# Patient Record
Sex: Female | Born: 1955 | Race: White | Hispanic: No | Smoking: Never smoker
Health system: Southern US, Community
[De-identification: ages and names within clinical notes are randomized; demographics above are authoritative.]

## PROBLEM LIST (undated history)

## (undated) DIAGNOSIS — D649 Anemia, unspecified: Secondary | ICD-10-CM

## (undated) DIAGNOSIS — E039 Hypothyroidism, unspecified: Secondary | ICD-10-CM

## (undated) DIAGNOSIS — M1990 Unspecified osteoarthritis, unspecified site: Secondary | ICD-10-CM

## (undated) DIAGNOSIS — E739 Lactose intolerance, unspecified: Secondary | ICD-10-CM

## (undated) DIAGNOSIS — K635 Polyp of colon: Secondary | ICD-10-CM

## (undated) DIAGNOSIS — F419 Anxiety disorder, unspecified: Secondary | ICD-10-CM

## (undated) DIAGNOSIS — M8580 Other specified disorders of bone density and structure, unspecified site: Secondary | ICD-10-CM

## (undated) HISTORY — DX: Hypothyroidism, unspecified: E03.9

## (undated) HISTORY — DX: Unspecified osteoarthritis, unspecified site: M19.90

## (undated) HISTORY — PX: REPLACEMENT TOTAL KNEE: SUR1224

## (undated) HISTORY — PX: INGUINAL HERNIA REPAIR: SUR1180

## (undated) HISTORY — DX: Other specified disorders of bone density and structure, unspecified site: M85.80

## (undated) HISTORY — DX: Lactose intolerance, unspecified: E73.9

## (undated) HISTORY — DX: Anxiety disorder, unspecified: F41.9

## (undated) HISTORY — PX: TONSILLECTOMY: SUR1361

## (undated) HISTORY — DX: Anemia, unspecified: D64.9

## (undated) HISTORY — DX: Polyp of colon: K63.5

---

## 2003-07-13 ENCOUNTER — Other Ambulatory Visit: Admission: RE | Admit: 2003-07-13 | Discharge: 2003-07-13 | Payer: Self-pay | Admitting: Family Medicine

## 2004-04-05 ENCOUNTER — Encounter (INDEPENDENT_AMBULATORY_CARE_PROVIDER_SITE_OTHER): Payer: Self-pay | Admitting: Specialist

## 2004-04-05 ENCOUNTER — Ambulatory Visit (HOSPITAL_COMMUNITY): Admission: RE | Admit: 2004-04-05 | Discharge: 2004-04-06 | Payer: Self-pay | Admitting: Surgery

## 2004-08-28 ENCOUNTER — Other Ambulatory Visit: Admission: RE | Admit: 2004-08-28 | Discharge: 2004-08-28 | Payer: Self-pay | Admitting: Obstetrics and Gynecology

## 2006-06-04 ENCOUNTER — Ambulatory Visit: Payer: Self-pay | Admitting: Endocrinology

## 2006-06-04 LAB — CONVERTED CEMR LAB
Calcium, Total (PTH): 9 mg/dL (ref 8.4–10.5)
PTH: 12 pg/mL — ABNORMAL LOW (ref 14.0–72.0)
TSH: 0.31 microintl units/mL — ABNORMAL LOW (ref 0.35–5.50)

## 2006-08-15 ENCOUNTER — Other Ambulatory Visit: Admission: RE | Admit: 2006-08-15 | Discharge: 2006-08-15 | Payer: Self-pay | Admitting: Obstetrics and Gynecology

## 2007-01-11 ENCOUNTER — Encounter: Payer: Self-pay | Admitting: *Deleted

## 2007-01-11 DIAGNOSIS — E0500 Thyrotoxicosis with diffuse goiter without thyrotoxic crisis or storm: Secondary | ICD-10-CM | POA: Insufficient documentation

## 2007-01-11 DIAGNOSIS — M810 Age-related osteoporosis without current pathological fracture: Secondary | ICD-10-CM | POA: Insufficient documentation

## 2007-01-11 DIAGNOSIS — E0590 Thyrotoxicosis, unspecified without thyrotoxic crisis or storm: Secondary | ICD-10-CM | POA: Insufficient documentation

## 2007-01-11 DIAGNOSIS — F329 Major depressive disorder, single episode, unspecified: Secondary | ICD-10-CM | POA: Insufficient documentation

## 2010-08-25 NOTE — Consult Note (Signed)
[facility]                          ENDOCRINOLOGY CONSULTATION   NAME:Pontillo, Tangi                          MRN:          [id number]  DATE:06/04/2006                            DOB:          [birth year]    She is self referred.   REASON FOR REFERRAL:  Thyroid disorder.   HISTORY OF PRESENT ILLNESS:  A 55 year old woman who in 2005 had a  thyroidectomy for hyperthyroidism.  She now takes Synthroid 88 mcg a  day, plus Cytomel 10 mcg in the morning and 5 mcg q.p.m.  She was  initially on just the Synthroid but she states that she felt tired on  this so the Cytomel was added.  Symptomatically, she has 2 months of  moderate fatigue with associated tremor of her hands.   PAST MEDICAL HISTORY:  1. Osteoporosis.  2. Depression.   MEDICATIONS:  1. Cytomel and Synthroid as noted above.  2. Cymbalta 60 mg a day.  3. Actonel 35 mg weekly.  4. Calcium supplement 1,500 mg daily.   SOCIAL HISTORY:  She is divorced.  She works in Clinical biochemist.   FAMILY HISTORY:  Negative for thyroid disease.   REVIEW OF SYSTEMS:  She has occasional mild palpitations.  She has  varying weight, with alternating weight gain and weight loss, but not a  big change, and no particular trend.   PHYSICAL EXAMINATION:  Blood pressure 109/70, heart rate 83, temperature  98.6, weight 159.  IN GENERAL:  No distress.  SKIN:  Normal texture and temperature, not diaphoretic.  HEENT:  No proptosis, no periorbital swelling.  NECK:  She has a healed thyroidectomy scar.  No thyroid tissue is  palpable.  CHEST:  Clear to auscultation.  No respiratory distress.  CARDIOVASCULAR:  No JVD, no edema, regular rate and rhythm, no murmur.  Pedal pulses are intact.  NEUROLOGIC:  Alert and oriented.  Does not appear anxious nor depressed.  Sensation is intact to touch on the feet now.   LABORATORY STUDIES:  On June 04, 2006, TSH 0.31, parathyroid hormone  slightly low at 12 picograms per  ml.  Calcium 9.0 mg/dl.   IMPRESSION:  1. History of hyperthyroidism due to Graves' disease, now status post      thyroidectomy.  2. On this schedule of thyroid medications, she is slightly over      supplemented.  3. Depression for which she takes Cymbalta.  4. Osteoporosis which could potentially be worsened by the over      supplementation of her thyroid hormones.  5. Very mild postoperative hypoparathyroidism which despite the fact      that the surgery was about 2 years ago, will still probably resolve      itself given more time.   PLAN:  1. I have called her back today, leaving a message advising      discontinuation of the Cytomel.  2. Recheck TSH in 30 days and I have set her up to have this done in      my office.  3. Continue the calcium supplements for now and she will need  an      annual check of her parathyroid hormone and calcium level.  I would      be happy to do that in my office, as her previous primary care      doctor, Dr. Idell Pickles, has retired.     Sean A. Everardo All, MD  Electronically Signed    SAE/MedQ  DD: 06/07/2006  DT: 06/07/2006  Job #: [id number]   cc:   Artist Pais, M.D.  Milagros Evener, M.D.  Velora Heckler, MD

## 2010-08-25 NOTE — Op Note (Signed)
Brenda Saunders, Brenda Saunders                 ACCOUNT NO.:  [id number]   MEDICAL RECORD NO.:  [id number]          PATIENT TYPE:  OIB   LOCATION:  2899                         FACILITY:  [facility]   PHYSICIAN:  Velora Heckler, MD      DATE OF BIRTH:  1956-02-05   DATE OF PROCEDURE:  04/05/2004  DATE OF DISCHARGE:                                 OPERATIVE REPORT   PREOPERATIVE DIAGNOSES:  1.  Graves' disease.  2.  Hyperthyroidism.   POSTOPERATIVE DIAGNOSES:  1.  Graves' disease.  2.  Hyperthyroidism.   PROCEDURE:  Total thyroidectomy.   SURGEON:  Velora Heckler, M.D.   ASSESSMENT:  Brenda Saunders, M.D.   ANESTHESIA:  General  per Dr. Maren Beach.   ESTIMATED BLOOD LOSS:  Minimal.   PREPARATION:  Betadine.   COMPLICATIONS:  None.   INDICATIONS FOR PROCEDURE:  The patient is a 55 year old white female  diagnosed in May 2004, with hyperthyroidism.  The patient was followed by  Dr. Dellis Anes. Heller and Dr. Dorisann Frames.  She was placed on methimazole  and Toprol for control of her symptoms.  The patient decided for surgery  over radioactive iodine treatment.   DESCRIPTION OF PROCEDURE:  The procedure is done in operating room #17 at  [facility]. The patient is brought to the operating room  and placed in a supine position on the operating room table.  Following the  administration of general anesthesia, the patient is prepped and draped in  the usual strict aseptic fashion.  After ascertaining that an adequate level  of anesthesia had been obtained, a Kocher incision is made with a #15 blade.  Dissection was carried down through the subcutaneous tissues and platysma.  Hemostasis is obtained with the electrocautery.  Skin flaps are developed  cephalad and caudad and a Mahorner self-retaining retractor is placed for  exposure.  The strap muscles are incised in the midline and the dissection  is begun on the left side of the neck.  The thyroid gland is moderately  enlarged.  There is prominent vasculature.  The middle thyroid vein is  divided between medium Ligaclips.  Smaller venous tributaries are divided  between small Ligaclips.  The superior pole is carefully dissected out,  ligated in continuity with #2-0 silk ties and medium Ligaclips, and divided.  The gland is rolled anteriorly.  The parathyroid tissues are identified and  preserved.  Branches of the inferior thyroid artery are divided between  small and medium Ligaclips.  The inferior venous tributaries are ligated  with #2-0 silk ties.  The ligament of Berry's is transected with the  electrocautery, and the gland is rolled anteriorly up and onto the trachea.  The small pyramidal lobe is dissected out with the electrocautery.  Next, we turned our attention to the right thyroid lobe.  Again, there are  prominent vascular structures.  The middle thyroid vein is ligated in  continuity with #2-0 silk ties and divided.  Further venous tributaries are  divided between small Ligaclips.  The inferior venous tributaries are  ligated  in continuity with #2-0 silk ties and divided.  The superior pole is  mobilized.  The vessels are ligated in continuity with #2-0 silk ties and  medium Ligaclips and divided.  The gland is rolled anteriorly.  The branches  of the inferior thyroid artery are divided between small Ligaclips.  The  recurrent laryngeal nerve is identified and preserved.  The parathyroid  tissue is identified and preserved.  The inferior thyroid artery is quite  prominent.  It is ligated in continuity with #2-0 silk ties and divided.  The gland is rolled further anteriorly and the ligament of Berry's  transected with the electrocautery.  The gland is excised completely off the  trachea.  The right superior pole is marked with a suture.  The gland is  submitted to pathology for review.  The neck is irrigated with warm saline  and hemostasis obtained with the electrocautery.  Surgicel was placed  over  the area of the recurrent laryngeal nerves bilaterally.  The strap muscles  are reapproximated in the midline with interrupted #3-0 Vicryl sutures.  The  platysma is closed with interrupted #3-0 Vicryl sutures.  The skin is closed  with a running #4-0 Vicryl subcuticular suture.  The wound is washed and  dried, and Benzoin and Steri-Strips are applied.  Sterile dressings are  applied.  The patient is awakened from anesthesia and brought to the recovery room in  stable condition.  The patient tolerated the procedure well.      Todd   TMG/MEDQ  D:  04/05/2004  T:  04/05/2004  Job:  [id number]   cc:   Dorisann Frames, M.D.  [facility] [address], [place] [address]  Fax: [phone]   Dellis Anes. Idell Pickles, M.D.  [address]  [place]  [place] [address]  Fax: [phone]

## 2012-01-21 HISTORY — PX: COLONOSCOPY: SHX174

## 2020-05-18 DIAGNOSIS — E890 Postprocedural hypothyroidism: Secondary | ICD-10-CM | POA: Diagnosis not present

## 2020-06-21 DIAGNOSIS — H43393 Other vitreous opacities, bilateral: Secondary | ICD-10-CM | POA: Diagnosis not present

## 2020-06-21 DIAGNOSIS — H16223 Keratoconjunctivitis sicca, not specified as Sjogren's, bilateral: Secondary | ICD-10-CM | POA: Diagnosis not present

## 2020-06-21 DIAGNOSIS — H2513 Age-related nuclear cataract, bilateral: Secondary | ICD-10-CM | POA: Diagnosis not present

## 2020-06-21 DIAGNOSIS — H43811 Vitreous degeneration, right eye: Secondary | ICD-10-CM | POA: Diagnosis not present

## 2020-08-08 DIAGNOSIS — E890 Postprocedural hypothyroidism: Secondary | ICD-10-CM | POA: Diagnosis not present

## 2020-08-08 DIAGNOSIS — R7301 Impaired fasting glucose: Secondary | ICD-10-CM | POA: Diagnosis not present

## 2020-08-08 DIAGNOSIS — M810 Age-related osteoporosis without current pathological fracture: Secondary | ICD-10-CM | POA: Diagnosis not present

## 2020-08-08 DIAGNOSIS — Z23 Encounter for immunization: Secondary | ICD-10-CM | POA: Diagnosis not present

## 2020-08-08 DIAGNOSIS — Z1211 Encounter for screening for malignant neoplasm of colon: Secondary | ICD-10-CM | POA: Diagnosis not present

## 2020-08-08 DIAGNOSIS — E559 Vitamin D deficiency, unspecified: Secondary | ICD-10-CM | POA: Diagnosis not present

## 2020-08-08 DIAGNOSIS — Z0000 Encounter for general adult medical examination without abnormal findings: Secondary | ICD-10-CM | POA: Diagnosis not present

## 2020-09-28 ENCOUNTER — Other Ambulatory Visit: Payer: Self-pay | Admitting: Family Medicine

## 2020-09-28 DIAGNOSIS — R928 Other abnormal and inconclusive findings on diagnostic imaging of breast: Secondary | ICD-10-CM

## 2020-09-28 DIAGNOSIS — R921 Mammographic calcification found on diagnostic imaging of breast: Secondary | ICD-10-CM | POA: Diagnosis not present

## 2020-09-28 DIAGNOSIS — R922 Inconclusive mammogram: Secondary | ICD-10-CM | POA: Diagnosis not present

## 2020-09-28 DIAGNOSIS — N6320 Unspecified lump in the left breast, unspecified quadrant: Secondary | ICD-10-CM | POA: Diagnosis not present

## 2020-11-09 ENCOUNTER — Other Ambulatory Visit: Payer: Self-pay

## 2020-11-09 ENCOUNTER — Ambulatory Visit
Admission: RE | Admit: 2020-11-09 | Discharge: 2020-11-09 | Disposition: A | Discharge status: Home / Self Care | Payer: Medicare Other | Source: Ambulatory Visit | Attending: Family Medicine | Admitting: Family Medicine

## 2020-11-09 DIAGNOSIS — R928 Other abnormal and inconclusive findings on diagnostic imaging of breast: Secondary | ICD-10-CM

## 2020-11-09 DIAGNOSIS — R921 Mammographic calcification found on diagnostic imaging of breast: Secondary | ICD-10-CM | POA: Diagnosis not present

## 2020-11-09 DIAGNOSIS — N6489 Other specified disorders of breast: Secondary | ICD-10-CM | POA: Diagnosis not present

## 2021-02-02 DIAGNOSIS — E7800 Pure hypercholesterolemia, unspecified: Secondary | ICD-10-CM | POA: Diagnosis not present

## 2021-02-02 DIAGNOSIS — E559 Vitamin D deficiency, unspecified: Secondary | ICD-10-CM | POA: Diagnosis not present

## 2021-02-02 DIAGNOSIS — E890 Postprocedural hypothyroidism: Secondary | ICD-10-CM | POA: Diagnosis not present

## 2021-02-02 DIAGNOSIS — R7301 Impaired fasting glucose: Secondary | ICD-10-CM | POA: Diagnosis not present

## 2021-02-08 DIAGNOSIS — E559 Vitamin D deficiency, unspecified: Secondary | ICD-10-CM | POA: Diagnosis not present

## 2021-02-08 DIAGNOSIS — M810 Age-related osteoporosis without current pathological fracture: Secondary | ICD-10-CM | POA: Diagnosis not present

## 2021-02-08 DIAGNOSIS — R7301 Impaired fasting glucose: Secondary | ICD-10-CM | POA: Diagnosis not present

## 2021-02-08 DIAGNOSIS — E890 Postprocedural hypothyroidism: Secondary | ICD-10-CM | POA: Diagnosis not present

## 2021-04-19 DIAGNOSIS — E890 Postprocedural hypothyroidism: Secondary | ICD-10-CM | POA: Diagnosis not present

## 2021-07-25 DIAGNOSIS — E559 Vitamin D deficiency, unspecified: Secondary | ICD-10-CM | POA: Diagnosis not present

## 2021-07-25 DIAGNOSIS — E7800 Pure hypercholesterolemia, unspecified: Secondary | ICD-10-CM | POA: Diagnosis not present

## 2021-07-25 DIAGNOSIS — R7301 Impaired fasting glucose: Secondary | ICD-10-CM | POA: Diagnosis not present

## 2021-07-25 DIAGNOSIS — E890 Postprocedural hypothyroidism: Secondary | ICD-10-CM | POA: Diagnosis not present

## 2021-08-01 DIAGNOSIS — E559 Vitamin D deficiency, unspecified: Secondary | ICD-10-CM | POA: Diagnosis not present

## 2021-08-01 DIAGNOSIS — M810 Age-related osteoporosis without current pathological fracture: Secondary | ICD-10-CM | POA: Diagnosis not present

## 2021-08-01 DIAGNOSIS — E890 Postprocedural hypothyroidism: Secondary | ICD-10-CM | POA: Diagnosis not present

## 2021-08-01 DIAGNOSIS — R7301 Impaired fasting glucose: Secondary | ICD-10-CM | POA: Diagnosis not present

## 2021-09-14 DIAGNOSIS — H524 Presbyopia: Secondary | ICD-10-CM | POA: Diagnosis not present

## 2021-10-04 ENCOUNTER — Encounter: Payer: Self-pay | Admitting: Gastroenterology

## 2021-10-18 DIAGNOSIS — Z1331 Encounter for screening for depression: Secondary | ICD-10-CM | POA: Diagnosis not present

## 2021-10-18 DIAGNOSIS — Z0000 Encounter for general adult medical examination without abnormal findings: Secondary | ICD-10-CM | POA: Diagnosis not present

## 2021-10-18 DIAGNOSIS — J351 Hypertrophy of tonsils: Secondary | ICD-10-CM | POA: Diagnosis not present

## 2021-10-18 DIAGNOSIS — Z6829 Body mass index (BMI) 29.0-29.9, adult: Secondary | ICD-10-CM | POA: Diagnosis not present

## 2021-11-23 ENCOUNTER — Ambulatory Visit: Payer: Medicare Other | Admitting: Gastroenterology

## 2021-11-23 ENCOUNTER — Encounter: Payer: Self-pay | Admitting: Gastroenterology

## 2021-11-23 VITALS — BP 100/62 | HR 80 | Ht 63.0 in | Wt 168.5 lb

## 2021-11-23 DIAGNOSIS — Z1211 Encounter for screening for malignant neoplasm of colon: Secondary | ICD-10-CM | POA: Diagnosis not present

## 2021-11-23 DIAGNOSIS — Z8371 Family history of colonic polyps: Secondary | ICD-10-CM

## 2021-11-23 DIAGNOSIS — Z1212 Encounter for screening for malignant neoplasm of rectum: Secondary | ICD-10-CM

## 2021-11-23 NOTE — Progress Notes (Signed)
Chief Complaint: For colon  Referring Provider:  Ronita Hipps, MD      ASSESSMENT AND PLAN;   #1. CRC sceening (FH polyps- mom at age 68). H/O polyp.  #2. IBS-C   Plan: -Colon with 2 day prep -Colace 2/day -Add low-dose magnesium -Increase water intake.    Discussed risks & benefits of colonoscopy. Risks including rare perforation req laparotomy, bleeding after bx/polypectomy req blood transfusion, rarely missing neoplasms, risks of anesthesia/sedation, rare risk of damage to internal organs. Benefits outweigh the risks. Patient agrees to proceed. All the questions were answered. Pt consents to proceed.   HPI:    Brenda Saunders is a 66 y.o. female  IBS-C, bordeline DM, hypothyroidism (followed by Dr Chalmers Cater), HLD, anxiety/depression, OA, lactose intolerance  With longstanding chronic constipation x >20 yrs Currently taking stool stool softeners per day and having BMs 1/day Gets worse with calcium intake. Abdominal bloating with lower abdominal discomfort which gets better with BMs. Has not been passing any pellet-like stools ever since she is on stool softeners. Takes MiraLAX on as-needed basis No melena or hematochezia.  No recent weight loss.  She denies having any nausea, vomiting, heartburn, regurgitation, odynophagia or dysphagia.  No sodas, chocolates, chewing gums, artificial sweeteners and candy. No NSAIDs.    Wt Readings from Last 3 Encounters:  11/23/21 168 lb 8 oz (76.4 kg)  06/04/06 159 lb (72.1 kg)    Past GI procedures:  Colonoscopy 01/21/2012 ([facility]) -Colonic polyp (1 cm) status post polypectomy. Lost in stool, not retrieved. Fair prep -Internal hemorrhoids -Letter sent to rpt in 3 yrs prev  Past Medical History:  Diagnosis Date   Anemia    Anxiety    Arthritis    Colon polyps    Hypothyroidism    Lactose intolerance    Osteopenia     Past Surgical History:  Procedure Laterality Date   COLONOSCOPY  01/21/2012   Colonic polyp status  post polypectomy. Internal hemorrhoids.   INGUINAL HERNIA REPAIR Right    REPLACEMENT TOTAL KNEE Right    TONSILLECTOMY      Family History  Problem Relation Age of Onset   Colon polyps Mother    Diabetes Mother    Hyperlipidemia Mother    Pulmonary fibrosis Mother    Diabetes Sister     Social History   Tobacco Use   Smoking status: Never   Smokeless tobacco: Never  Vaping Use   Vaping Use: Never used  Substance Use Topics   Alcohol use: Never   Drug use: Never    Current Outpatient Medications  Medication Sig Dispense Refill   ALPRAZolam (XANAX) 0.5 MG tablet Take 0.5 mg by mouth 4 (four) times daily as needed.     atorvastatin (LIPITOR) 10 MG tablet Take 10 mg by mouth daily.     CALCIUM-VITAMIN D PO Take by mouth. 1200-20 1 tab daily     diphenhydramine-acetaminophen (TYLENOL PM) 25-500 MG TABS tablet Take 2 tablets by mouth at bedtime as needed.     docusate sodium (COLACE) 100 MG capsule Take 100 mg by mouth as needed for mild constipation.     FLUoxetine (PROZAC) 10 MG capsule Take 10 mg by mouth daily.     FLUoxetine (PROZAC) 40 MG capsule Take 40 mg by mouth daily.     Glucosamine 500 MG CAPS Take 1 tablet by mouth daily.     ibandronate (BONIVA) 150 MG tablet Take 150 mg by mouth every 30 (thirty) days.  levothyroxine (SYNTHROID) 100 MCG tablet Take 100 mcg by mouth. 6 days weekly     polyethylene glycol powder (GLYCOLAX/MIRALAX) 17 GM/SCOOP powder Take 17 g by mouth daily.     Probiotic Product (PROBIOTIC PO) Take 1 capsule by mouth daily.     RESTASIS 0.05 % ophthalmic emulsion Place 1 drop into both eyes daily.     No current facility-administered medications for this visit.    No Known Allergies  Review of Systems:  Constitutional: Denies fever, chills, diaphoresis, appetite change and fatigue.  HEENT: Denies photophobia, eye pain, redness, hearing loss, ear pain, congestion, sore throat, rhinorrhea, sneezing, mouth sores, neck pain, neck stiffness  and tinnitus.   Respiratory: Denies SOB, DOE, cough, chest tightness,  and wheezing.   Cardiovascular: Denies chest pain, palpitations and leg swelling.  Genitourinary: Denies dysuria, urgency, frequency, hematuria, flank pain and difficulty urinating.  Musculoskeletal: Denies myalgias, has back pain, No joint swelling, arthralgias and gait problem.  Skin: No rash.  Neurological: Denies dizziness, seizures, syncope, weakness, light-headedness, numbness and headaches.  Hematological: Denies adenopathy. Easy bruising, personal or family bleeding history  Psychiatric/Behavioral: Has anxiety or depression     Physical Exam:    BP 100/62 (BP Location: Left Arm, Patient Position: Sitting, Cuff Size: Normal)   Pulse 80   Ht '5\' 3"'$  (1.6 m)   Wt 168 lb 8 oz (76.4 kg)   BMI 29.85 kg/m  Wt Readings from Last 3 Encounters:  11/23/21 168 lb 8 oz (76.4 kg)  06/04/06 159 lb (72.1 kg)   Constitutional:  Well-developed, in no acute distress. Psychiatric: Normal mood and affect. Behavior is normal. HEENT: Pupils normal.  Conjunctivae are normal. No scleral icterus. Cardiovascular: Normal rate, regular rhythm. No edema Pulmonary/chest: Effort normal and breath sounds normal. No wheezing, rales or rhonchi. Abdominal: Soft, nondistended. Nontender. Bowel sounds active throughout. There are no masses palpable. No hepatomegaly. Rectal: Deferred Neurological: Alert and oriented to person place and time. Skin: Skin is warm and dry. No rashes noted.    Carmell Austria, MD 11/23/2021, 10:44 AM  Cc: Ronita Hipps, MD

## 2021-11-23 NOTE — Patient Instructions (Addendum)
_______________________________________________________  If you are age 68 or older, your body mass index should be between 23-30. Your Body mass index is 29.85 kg/m. If this is out of the aforementioned range listed, please consider follow up with your Primary Care Provider.  If you are age 74 or younger, your body mass index should be between 19-25. Your Body mass index is 29.85 kg/m. If this is out of the aformentioned range listed, please consider follow up with your Primary Care Provider.   ________________________________________________________  The [facility] providers would like to encourage you to use [facility] to communicate with providers for non-urgent requests or questions.  Due to long hold times on the telephone, sending your provider a message by [facility] may be a faster and more efficient way to get a response.  Please allow 48 business hours for a response.  Please remember that this is for non-urgent requests.  _______________________________________________________  Brenda Saunders have been scheduled for a colonoscopy. Please follow written instructions given to you at your visit today.  Please pick up your prep supplies at the pharmacy within the next 1-3 days. If you use inhalers (even only as needed), please bring them with you on the day of your procedure.  Two days before your procedure: Mix 3 packs (or capfuls) of Miralax in 48 ounces of clear liquid and drink at 6pm.  You can add Magnesium '100mg'$  daily and colace you can do 2 a day  We have given you samples of the following medication to take: Clenpiq  Thank you,  Dr. Jackquline Denmark

## 2021-12-26 ENCOUNTER — Encounter: Payer: Self-pay | Admitting: Certified Registered Nurse Anesthetist

## 2021-12-27 ENCOUNTER — Encounter: Payer: Self-pay | Admitting: Gastroenterology

## 2022-01-03 ENCOUNTER — Telehealth: Payer: Self-pay

## 2022-01-03 ENCOUNTER — Encounter: Payer: Self-pay | Admitting: Gastroenterology

## 2022-01-03 ENCOUNTER — Ambulatory Visit (AMBULATORY_SURGERY_CENTER): Payer: Medicare Other | Admitting: Gastroenterology

## 2022-01-03 VITALS — BP 114/57 | HR 64 | Temp 97.5°F | Resp 15 | Ht 63.0 in | Wt 168.0 lb

## 2022-01-03 DIAGNOSIS — Z09 Encounter for follow-up examination after completed treatment for conditions other than malignant neoplasm: Secondary | ICD-10-CM | POA: Diagnosis not present

## 2022-01-03 DIAGNOSIS — D123 Benign neoplasm of transverse colon: Secondary | ICD-10-CM | POA: Diagnosis not present

## 2022-01-03 DIAGNOSIS — Z1211 Encounter for screening for malignant neoplasm of colon: Secondary | ICD-10-CM

## 2022-01-03 DIAGNOSIS — D122 Benign neoplasm of ascending colon: Secondary | ICD-10-CM | POA: Diagnosis not present

## 2022-01-03 DIAGNOSIS — Z86010 Personal history of colonic polyps: Secondary | ICD-10-CM

## 2022-01-03 DIAGNOSIS — D128 Benign neoplasm of rectum: Secondary | ICD-10-CM

## 2022-01-03 DIAGNOSIS — Z8371 Family history of colonic polyps: Secondary | ICD-10-CM

## 2022-01-03 DIAGNOSIS — D125 Benign neoplasm of sigmoid colon: Secondary | ICD-10-CM

## 2022-01-03 MED ORDER — SODIUM CHLORIDE 0.9 % IV SOLN: 500.0000 mL | Freq: Once | INTRAVENOUS | Status: DC

## 2022-01-03 NOTE — Telephone Encounter (Signed)
Pt stated that she recently had an office visit on August 17th and her insurance is stating that they are going to deny coverage for that office visit: Pt stated that her insurance company  stated that the way that it was coded is why they denied the coverage.  Please advise

## 2022-01-03 NOTE — Progress Notes (Signed)
Report given to PACU, vss 

## 2022-01-03 NOTE — Progress Notes (Signed)
Pt's states no medical or surgical changes since previsit or office visit. 

## 2022-01-03 NOTE — Progress Notes (Signed)
Chief Complaint: For colon  Referring Provider:  Ronita Hipps, MD      ASSESSMENT AND PLAN;   #1. CRC sceening (FH polyps- mom at age 66). H/O polyp.  #2. IBS-C   Plan: -Colon with 2 day prep -Colace 2/day -Add low-dose magnesium -Increase water intake.    Discussed risks & benefits of colonoscopy. Risks including rare perforation req laparotomy, bleeding after bx/polypectomy req blood transfusion, rarely missing neoplasms, risks of anesthesia/sedation, rare risk of damage to internal organs. Benefits outweigh the risks. Patient agrees to proceed. All the questions were answered. Pt consents to proceed.   HPI:    Brenda Saunders is a 66 y.o. female  IBS-C, bordeline DM, hypothyroidism (followed by Dr Chalmers Cater), HLD, anxiety/depression, OA, lactose intolerance  With longstanding chronic constipation x >20 yrs Currently taking stool stool softeners per day and having BMs 1/day Gets worse with calcium intake. Abdominal bloating with lower abdominal discomfort which gets better with BMs. Has not been passing any pellet-like stools ever since she is on stool softeners. Takes MiraLAX on as-needed basis No melena or hematochezia.  No recent weight loss.  She denies having any nausea, vomiting, heartburn, regurgitation, odynophagia or dysphagia.  No sodas, chocolates, chewing gums, artificial sweeteners and candy. No NSAIDs.    Wt Readings from Last 3 Encounters:  01/03/22 168 lb (76.2 kg)  11/23/21 168 lb 8 oz (76.4 kg)  06/04/06 159 lb (72.1 kg)    Past GI procedures:  Colonoscopy 01/21/2012 ([facility]) -Colonic polyp (1 cm) status post polypectomy. Lost in stool, not retrieved. Fair prep -Internal hemorrhoids -Letter sent to rpt in 3 yrs prev  Past Medical History:  Diagnosis Date   Anemia    Anxiety    Arthritis    Colon polyps    Hypothyroidism    Lactose intolerance    Osteopenia     Past Surgical History:  Procedure Laterality Date   COLONOSCOPY   01/21/2012   Colonic polyp status post polypectomy. Internal hemorrhoids.   INGUINAL HERNIA REPAIR Right    REPLACEMENT TOTAL KNEE Right    TONSILLECTOMY      Family History  Problem Relation Age of Onset   Colon polyps Mother    Diabetes Mother    Hyperlipidemia Mother    Pulmonary fibrosis Mother    Diabetes Sister     Social History   Tobacco Use   Smoking status: Never   Smokeless tobacco: Never  Vaping Use   Vaping Use: Never used  Substance Use Topics   Alcohol use: Never   Drug use: Never    Current Outpatient Medications  Medication Sig Dispense Refill   ALPRAZolam (XANAX) 0.5 MG tablet Take 0.5 mg by mouth 4 (four) times daily as needed.     atorvastatin (LIPITOR) 10 MG tablet Take 10 mg by mouth daily.     CALCIUM-VITAMIN D PO Take by mouth. 1200-20 1 tab daily     diphenhydramine-acetaminophen (TYLENOL PM) 25-500 MG TABS tablet Take 2 tablets by mouth at bedtime as needed.     docusate sodium (COLACE) 100 MG capsule Take 100 mg by mouth as needed for mild constipation.     FLUoxetine (PROZAC) 10 MG capsule Take 10 mg by mouth daily.     FLUoxetine (PROZAC) 40 MG capsule Take 40 mg by mouth daily.     Glucosamine 500 MG CAPS Take 1 tablet by mouth daily.     ibandronate (BONIVA) 150 MG tablet Take 150 mg by mouth every  30 (thirty) days.     levothyroxine (SYNTHROID) 100 MCG tablet Take 100 mcg by mouth. 6 days weekly     polyethylene glycol powder (GLYCOLAX/MIRALAX) 17 GM/SCOOP powder Take 17 g by mouth daily.     Probiotic Product (PROBIOTIC PO) Take 1 capsule by mouth daily.     RESTASIS 0.05 % ophthalmic emulsion Place 1 drop into both eyes daily.     No current facility-administered medications for this visit.    No Known Allergies  Review of Systems:  Constitutional: Denies fever, chills, diaphoresis, appetite change and fatigue.  HEENT: Denies photophobia, eye pain, redness, hearing loss, ear pain, congestion, sore throat, rhinorrhea, sneezing, mouth  sores, neck pain, neck stiffness and tinnitus.   Respiratory: Denies SOB, DOE, cough, chest tightness,  and wheezing.   Cardiovascular: Denies chest pain, palpitations and leg swelling.  Genitourinary: Denies dysuria, urgency, frequency, hematuria, flank pain and difficulty urinating.  Musculoskeletal: Denies myalgias, has back pain, No joint swelling, arthralgias and gait problem.  Skin: No rash.  Neurological: Denies dizziness, seizures, syncope, weakness, light-headedness, numbness and headaches.  Hematological: Denies adenopathy. Easy bruising, personal or family bleeding history  Psychiatric/Behavioral: Has anxiety or depression     Physical Exam:    BP (!) 112/57   Pulse 70   Temp (!) 97.5 F (36.4 C)   Ht '5\' 3"'$  (1.6 m)   Wt 168 lb (76.2 kg)   SpO2 98%   BMI 29.76 kg/m  Wt Readings from Last 3 Encounters:  01/03/22 168 lb (76.2 kg)  11/23/21 168 lb 8 oz (76.4 kg)  06/04/06 159 lb (72.1 kg)   Constitutional:  Well-developed, in no acute distress. Psychiatric: Normal mood and affect. Behavior is normal. HEENT: Pupils normal.  Conjunctivae are normal. No scleral icterus. Cardiovascular: Normal rate, regular rhythm. No edema Pulmonary/chest: Effort normal and breath sounds normal. No wheezing, rales or rhonchi. Abdominal: Soft, nondistended. Nontender. Bowel sounds active throughout. There are no masses palpable. No hepatomegaly. Rectal: Deferred Neurological: Alert and oriented to person place and time. Skin: Skin is warm and dry. No rashes noted.    Carmell Austria, MD 01/03/2022, 10:50 AM  Cc: Ronita Hipps, MD

## 2022-01-03 NOTE — Op Note (Signed)
[facility] Patient Name: Brenda Saunders Procedure Date: 01/03/2022 11:31 AM MRN: [id number] Endoscopist: Jackquline Denmark , MD Age: 66 Referring MD:  Date of Birth: 05/14/1955 Gender: Female Account #: [id number] Procedure:                Colonoscopy Indications:              High risk colon cancer surveillance: Personal                            history of colonic polyps 2013. FH of colon polyps                            (mom <50) Medicines:                Monitored Anesthesia Care Procedure:                Pre-Anesthesia Assessment:                           - Prior to the procedure, a History and Physical                            was performed, and patient medications and                            allergies were reviewed. The patient's tolerance of                            previous anesthesia was also reviewed. The risks                            and benefits of the procedure and the sedation                            options and risks were discussed with the patient.                            All questions were answered, and informed consent                            was obtained. Prior Anticoagulants: The patient has                            taken no previous anticoagulant or antiplatelet                            agents. ASA Grade Assessment: II - A patient with                            mild systemic disease. After reviewing the risks                            and benefits, the patient was deemed in  satisfactory condition to undergo the procedure.                           After obtaining informed consent, the colonoscope                            was passed under direct vision. Throughout the                            procedure, the patient's blood pressure, pulse, and                            oxygen saturations were monitored continuously. The                            Olympus PCF-H190DL ([id number]) Colonoscope was                             introduced through the anus and advanced to the the                            cecum, identified by appendiceal orifice and                            ileocecal valve. The colonoscopy was performed                            without difficulty. The colon was redundant. The                            passage of scope was assisted by abdominal                            pressure. The patient tolerated the procedure well.                            The quality of the bowel preparation was adequate                            to identify polyps. There was some retained stool                            despite 2-day prep. The ileocecal valve,                            appendiceal orifice, and rectum were photographed. Scope In: 11:39:48 AM Scope Out: 12:10:49 PM Scope Withdrawal Time: 0 hours 26 minutes 56 seconds  Total Procedure Duration: 0 hours 31 minutes 1 second  Findings:                 Three sessile polyps were found in the proximal                            transverse colon, proximal ascending colon and mid  ascending colon. The polyps were 4 to 8 mm in size.                            The smaller polyps were removed with a cold snare                            and larger with hot snare. Resection and retrieval                            were complete.                           A 12 mm polyp was found in the mid sigmoid colon,                            30 cm from the anal verge. The polyp was                            pedunculated. The polyp was removed with a hot                            snare. Resection and retrieval were complete. Area                            was tattooed with an injection of 1 mL of Spot                            (carbon black).                           A 12 mm polyp was found in the distal rectum (R                            posterio-lateral), just above the dentate line. The                             polyp was sessile. The polyp was removed with a hot                            snare. Resection and retrieval were complete.                           A few small-mouthed diverticula were found in the                            sigmoid colon.                           Non-bleeding internal hemorrhoids were found during                            retroflexion. The hemorrhoids were small and Grade  I (internal hemorrhoids that do not prolapse).                           The exam was otherwise without abnormality on                            direct and retroflexion views. Complications:            No immediate complications. Estimated Blood Loss:     Estimated blood loss: none. Impression:               - Three 4 to 8 mm polyps in the proximal transverse                            colon, in the proximal ascending colon and in the                            mid ascending colon, removed with a hot snare.                            Resected and retrieved.                           - One 12 mm polyp in the mid sigmoid colon, removed                            with a hot snare. Resected and retrieved. Tattooed.                           - One 12 mm polyp in the distal rectum, removed                            with a hot snare. Resected and retrieved.                           - Mild sigmoid diverticulosis.                           - Non-bleeding internal hemorrhoids.                           - The examination was otherwise normal on direct                            and retroflexion views. Recommendation:           - Patient has a contact number available for                            emergencies. The signs and symptoms of potential                            delayed complications were discussed with the  patient. Return to normal activities tomorrow.                            Written discharge instructions were provided to the                             patient.                           - Resume previous diet.                           - Continue present medications.                           - No aspirin, ibuprofen, naproxen, or other                            non-steroidal anti-inflammatory drugs after polyp                            removal x 5 days.                           - Await pathology results.                           - Repeat colonoscopy for surveillance based on                            pathology results. NOTE-she will require a 2-day                            prep.                           - The findings and recommendations were discussed                            with the patient's family. Jackquline Denmark, MD 01/03/2022 12:20:58 PM This report has been signed electronically.

## 2022-01-03 NOTE — Progress Notes (Signed)
Called to room to assist during endoscopic procedure.  Patient ID and intended procedure confirmed with present staff. Received instructions for my participation in the procedure from the performing physician.  

## 2022-01-03 NOTE — Patient Instructions (Signed)
Information on polyps and diverticulosis given to you today.  Await pathology results.  Resume previous diet and medications.   Avoid NSAIDS (Aspirin, Ibuprofen, Aleve, Naproxen) for 5 days post procedure. you may use Tylenol as needed.  Repeat colonoscopy for surveillance based on pathology results.  2 day prep required for next procedure.   YOU HAD AN ENDOSCOPIC PROCEDURE TODAY AT [facility]:   Refer to the procedure report that was given to you for any specific questions about what was found during the examination.  If the procedure report does not answer your questions, please call your gastroenterologist to clarify.  If you requested that your care partner not be given the details of your procedure findings, then the procedure report has been included in a sealed envelope for you to review at your convenience later.  YOU SHOULD EXPECT: Some feelings of bloating in the abdomen. Passage of more gas than usual.  Walking can help get rid of the air that was put into your GI tract during the procedure and reduce the bloating. If you had a lower endoscopy (such as a colonoscopy or flexible sigmoidoscopy) you may notice spotting of blood in your stool or on the toilet paper. If you underwent a bowel prep for your procedure, you may not have a normal bowel movement for a few days.  Please Note:  You might notice some irritation and congestion in your nose or some drainage.  This is from the oxygen used during your procedure.  There is no need for concern and it should clear up in a day or so.  SYMPTOMS TO REPORT IMMEDIATELY:  Following lower endoscopy (colonoscopy or flexible sigmoidoscopy):  Excessive amounts of blood in the stool  Significant tenderness or worsening of abdominal pains  Swelling of the abdomen that is new, acute  Fever of 100F or higher  For urgent or emergent issues, a gastroenterologist can be reached at any hour by calling [phone]. Do not use  MyChart messaging for urgent concerns.    DIET:  We do recommend a small meal at first, but then you may proceed to your regular diet.  Drink plenty of fluids but you should avoid alcoholic beverages for 24 hours.  ACTIVITY:  You should plan to take it easy for the rest of today and you should NOT DRIVE or use heavy machinery until tomorrow (because of the sedation medicines used during the test).    FOLLOW UP: Our staff will call the number listed on your records the next business day following your procedure.  We will call around 7:15- 8:00 am to check on you and address any questions or concerns that you may have regarding the information given to you following your procedure. If we do not reach you, we will leave a message.     If any biopsies were taken you will be contacted by phone or by letter within the next 1-3 weeks.  Please call us at [phone] if you have not heard about the biopsies in 3 weeks.    SIGNATURES/CONFIDENTIALITY: You and/or your care partner have signed paperwork which will be entered into your electronic medical record.  These signatures attest to the fact that that the information above on your After Visit Summary has been reviewed and is understood.  Full responsibility of the confidentiality of this discharge information lies with you and/or your care-partner.

## 2022-01-04 ENCOUNTER — Telehealth: Payer: Self-pay

## 2022-01-04 NOTE — Telephone Encounter (Signed)
  Follow up Call-     01/03/2022   10:48 AM  Call back number  Post procedure Call Back phone  # [phone]  Permission to leave phone message Yes     Patient questions:  Do you have a fever, pain , or abdominal swelling? No. Pain Score  0 *  Have you tolerated food without any problems? Yes.    Have you been able to return to your normal activities? Yes.    Do you have any questions about your discharge instructions: Diet   No. Medications  No. Follow up visit  No.  Do you have questions or concerns about your Care? No.  Actions: * If pain score is 4 or above: No action needed, pain <4.

## 2022-01-21 ENCOUNTER — Encounter: Payer: Self-pay | Admitting: Gastroenterology

## 2022-01-24 DIAGNOSIS — E559 Vitamin D deficiency, unspecified: Secondary | ICD-10-CM | POA: Diagnosis not present

## 2022-01-24 DIAGNOSIS — R7301 Impaired fasting glucose: Secondary | ICD-10-CM | POA: Diagnosis not present

## 2022-01-24 DIAGNOSIS — E7800 Pure hypercholesterolemia, unspecified: Secondary | ICD-10-CM | POA: Diagnosis not present

## 2022-01-24 DIAGNOSIS — E890 Postprocedural hypothyroidism: Secondary | ICD-10-CM | POA: Diagnosis not present

## 2022-01-24 NOTE — Telephone Encounter (Signed)
Voicemail not setup. Mychart sent Per [employer], medicare will not pay for that code although the code is office visit to see about a colonoscopy. Our billing dept has sent in the claim 2 times and her insurance have denied the first one. Its 125 dollars I was told that she will probably have to pay per her insurance.  Ref number [id number]

## 2022-02-01 DIAGNOSIS — R7301 Impaired fasting glucose: Secondary | ICD-10-CM | POA: Diagnosis not present

## 2022-02-01 DIAGNOSIS — M810 Age-related osteoporosis without current pathological fracture: Secondary | ICD-10-CM | POA: Diagnosis not present

## 2022-02-01 DIAGNOSIS — E559 Vitamin D deficiency, unspecified: Secondary | ICD-10-CM | POA: Diagnosis not present

## 2022-02-01 DIAGNOSIS — E890 Postprocedural hypothyroidism: Secondary | ICD-10-CM | POA: Diagnosis not present

## 2022-07-24 DIAGNOSIS — E559 Vitamin D deficiency, unspecified: Secondary | ICD-10-CM | POA: Diagnosis not present

## 2022-07-24 DIAGNOSIS — R7301 Impaired fasting glucose: Secondary | ICD-10-CM | POA: Diagnosis not present

## 2022-07-24 DIAGNOSIS — E7800 Pure hypercholesterolemia, unspecified: Secondary | ICD-10-CM | POA: Diagnosis not present

## 2022-07-24 DIAGNOSIS — E890 Postprocedural hypothyroidism: Secondary | ICD-10-CM | POA: Diagnosis not present

## 2022-08-02 DIAGNOSIS — E890 Postprocedural hypothyroidism: Secondary | ICD-10-CM | POA: Diagnosis not present

## 2022-08-02 DIAGNOSIS — E559 Vitamin D deficiency, unspecified: Secondary | ICD-10-CM | POA: Diagnosis not present

## 2022-08-02 DIAGNOSIS — M810 Age-related osteoporosis without current pathological fracture: Secondary | ICD-10-CM | POA: Diagnosis not present

## 2022-08-02 DIAGNOSIS — R7301 Impaired fasting glucose: Secondary | ICD-10-CM | POA: Diagnosis not present

## 2022-09-17 DIAGNOSIS — K080 Exfoliation of teeth due to systemic causes: Secondary | ICD-10-CM | POA: Diagnosis not present

## 2022-09-25 DIAGNOSIS — H524 Presbyopia: Secondary | ICD-10-CM | POA: Diagnosis not present

## 2022-10-22 DIAGNOSIS — Z0000 Encounter for general adult medical examination without abnormal findings: Secondary | ICD-10-CM | POA: Diagnosis not present

## 2022-10-22 DIAGNOSIS — Z1331 Encounter for screening for depression: Secondary | ICD-10-CM | POA: Diagnosis not present

## 2022-10-22 DIAGNOSIS — Z1339 Encounter for screening examination for other mental health and behavioral disorders: Secondary | ICD-10-CM | POA: Diagnosis not present

## 2022-10-22 DIAGNOSIS — M8580 Other specified disorders of bone density and structure, unspecified site: Secondary | ICD-10-CM | POA: Diagnosis not present

## 2023-01-23 DIAGNOSIS — E890 Postprocedural hypothyroidism: Secondary | ICD-10-CM | POA: Diagnosis not present

## 2023-01-23 DIAGNOSIS — E7800 Pure hypercholesterolemia, unspecified: Secondary | ICD-10-CM | POA: Diagnosis not present

## 2023-01-23 DIAGNOSIS — E559 Vitamin D deficiency, unspecified: Secondary | ICD-10-CM | POA: Diagnosis not present

## 2023-01-23 DIAGNOSIS — R7301 Impaired fasting glucose: Secondary | ICD-10-CM | POA: Diagnosis not present

## 2023-01-31 DIAGNOSIS — R7301 Impaired fasting glucose: Secondary | ICD-10-CM | POA: Diagnosis not present

## 2023-01-31 DIAGNOSIS — M810 Age-related osteoporosis without current pathological fracture: Secondary | ICD-10-CM | POA: Diagnosis not present

## 2023-01-31 DIAGNOSIS — E559 Vitamin D deficiency, unspecified: Secondary | ICD-10-CM | POA: Diagnosis not present

## 2023-01-31 DIAGNOSIS — E890 Postprocedural hypothyroidism: Secondary | ICD-10-CM | POA: Diagnosis not present

## 2023-01-31 DIAGNOSIS — Z23 Encounter for immunization: Secondary | ICD-10-CM | POA: Diagnosis not present

## 2023-03-17 IMAGING — MG MM BREAST LOCALIZATION CLIP
4 series · 4 of 12 positions shown · non-contrast
Comparison: Previous exam(s).

CLINICAL DATA: Status post right breast stereotactic biopsy.

EXAM:
3D DIAGNOSTIC RIGHT MAMMOGRAM POST STEREOTACTIC BIOPSY

[R ML synth-2D]
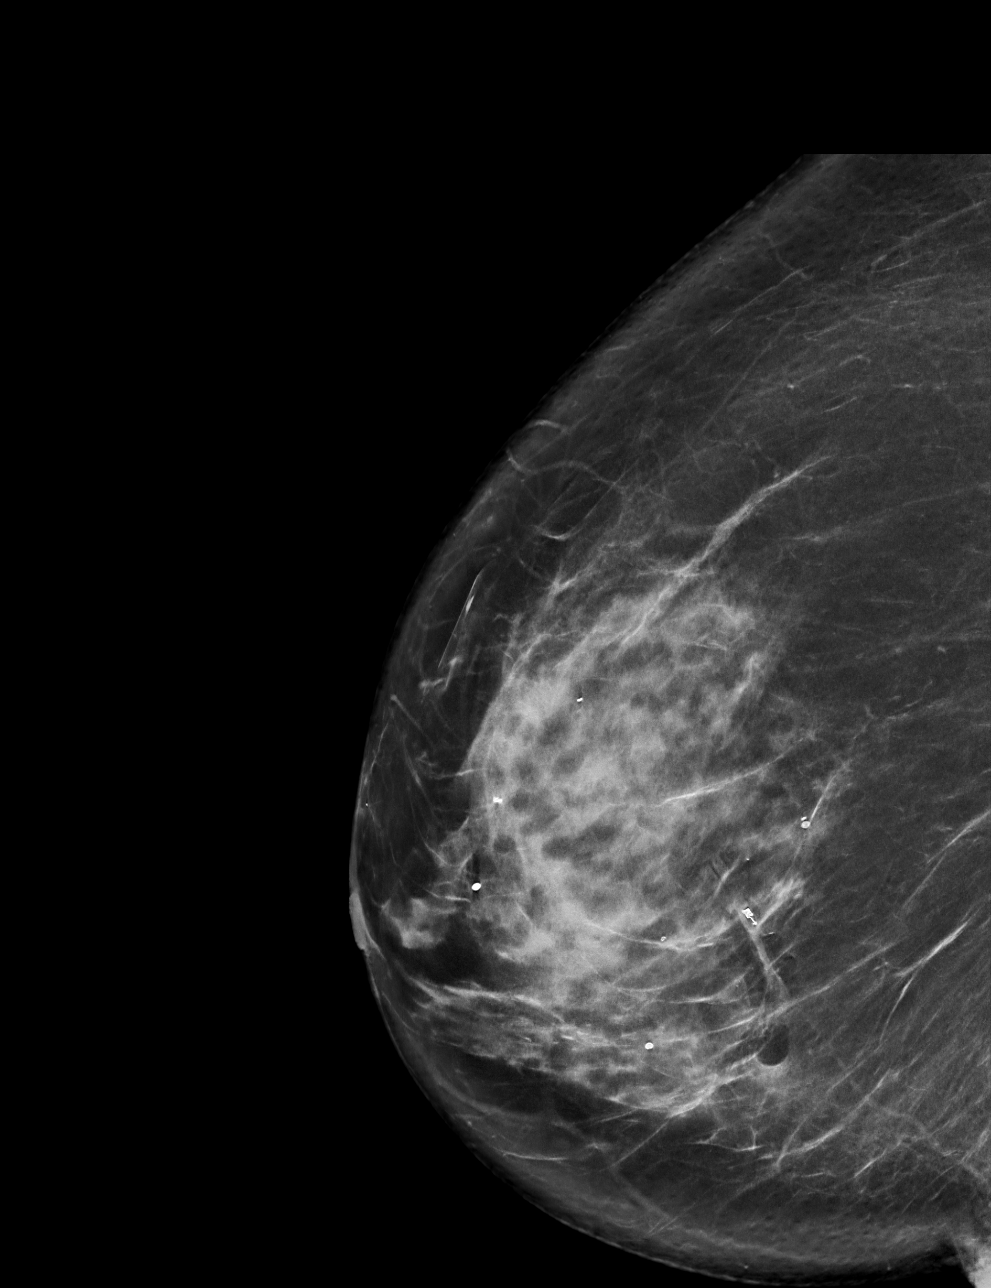

[R CC synth-2D]
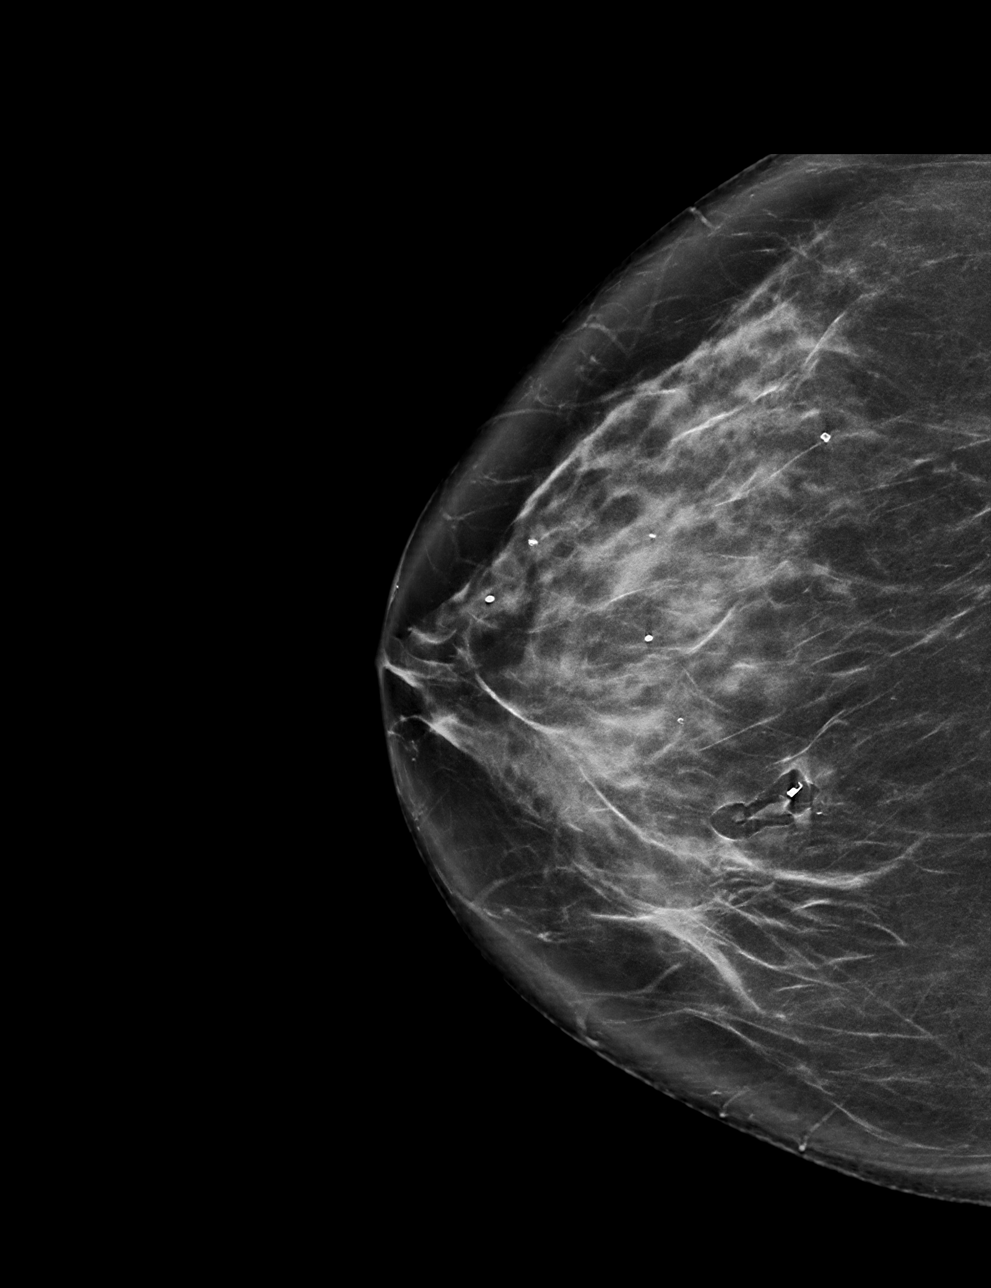

[R ML tomo · tomo slice 43/84.0]
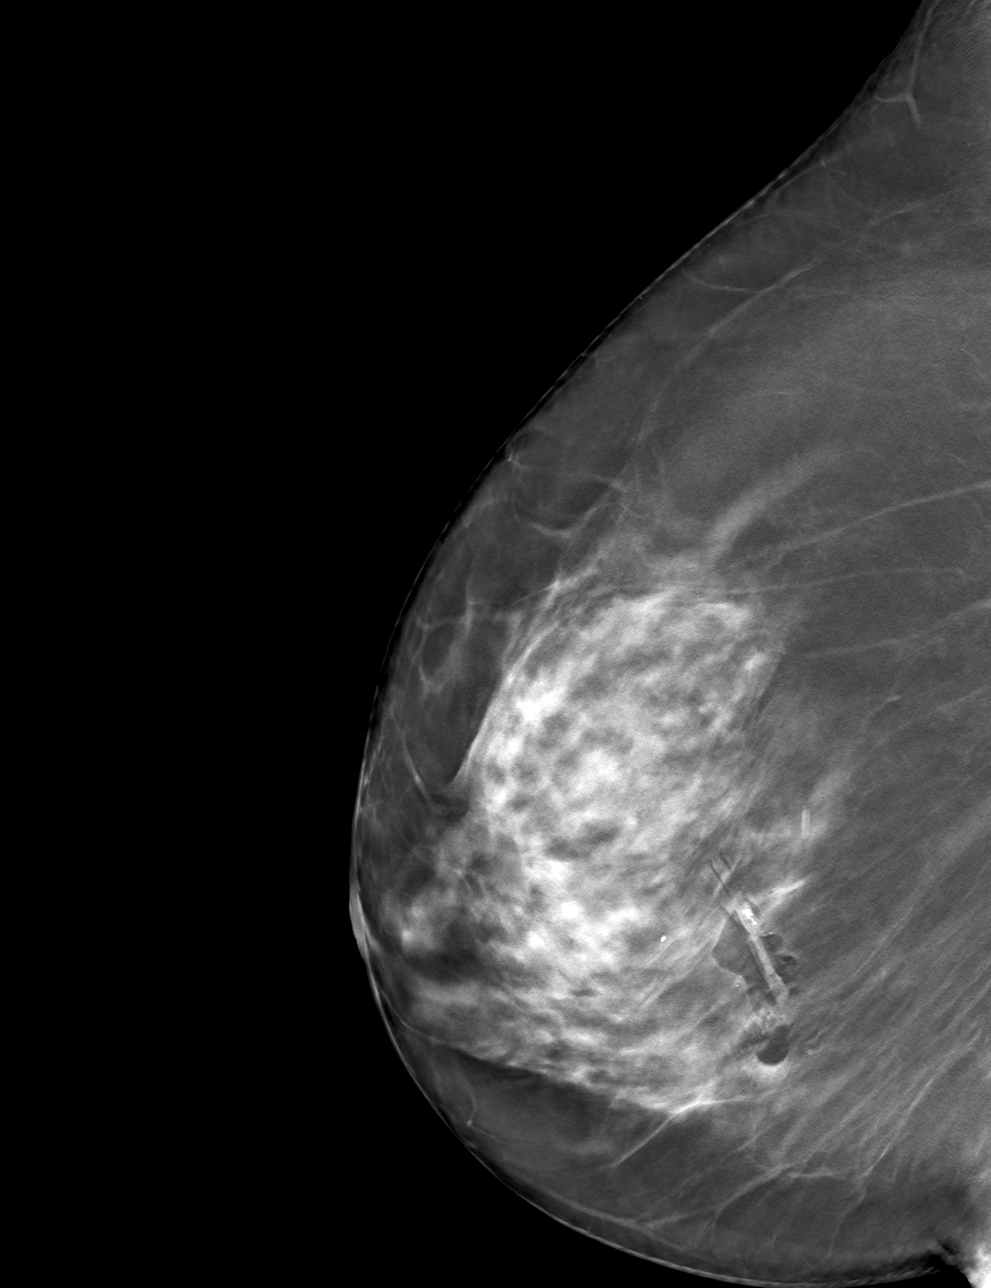

[R CC tomo · tomo slice 40/79.0]
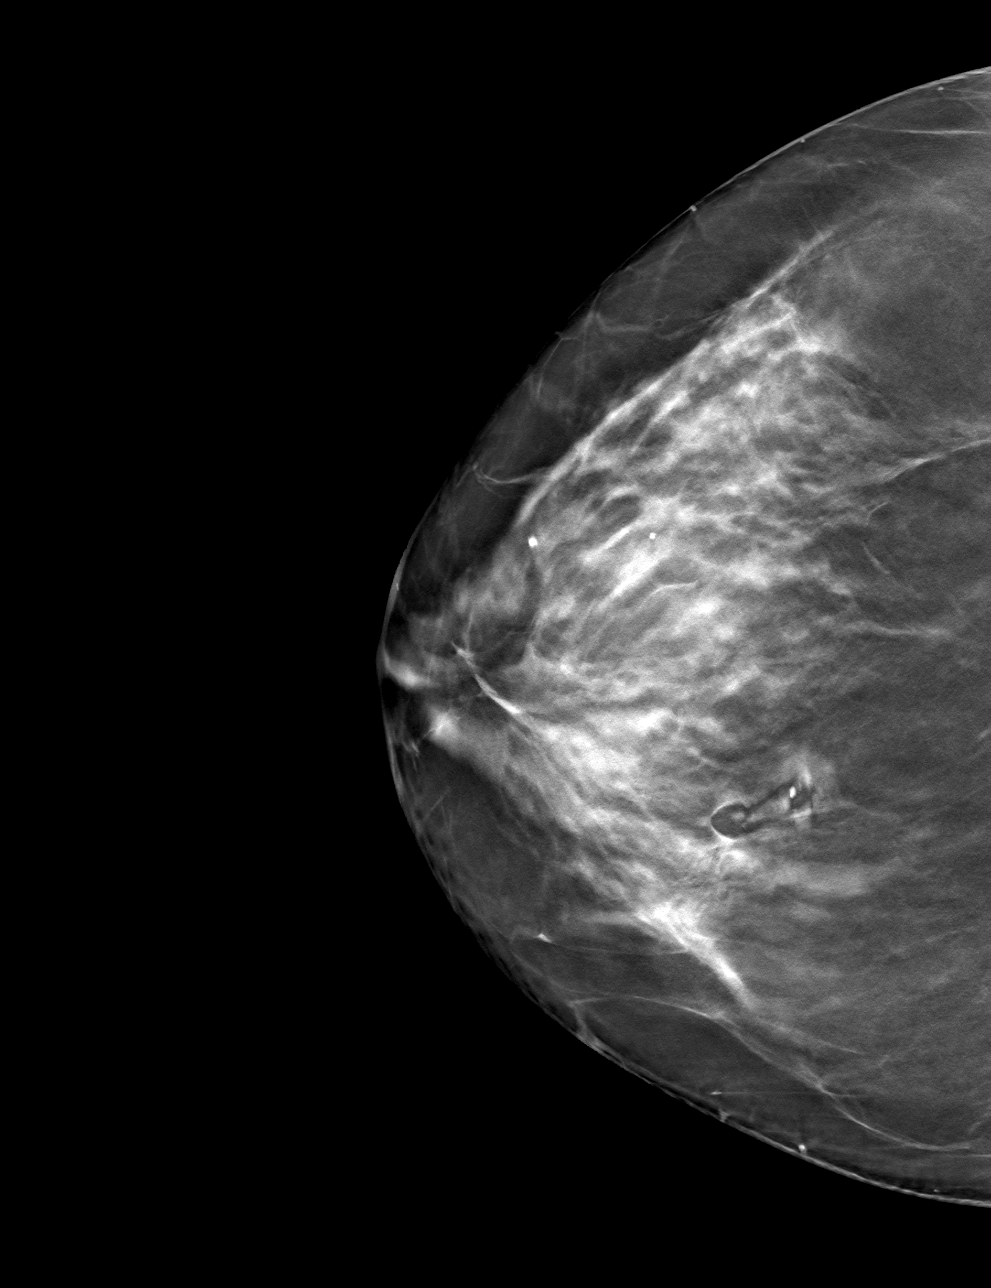

[4 of 12 positions shown; findings below may reference images not displayed]

FINDINGS: 3D Mammographic images were obtained following stereotactic guided
biopsy of the right breast. The biopsy marking clip is in expected
position at the site of biopsy.
IMPRESSION: Appropriate positioning of the coil shaped biopsy marking clip at
the site of biopsy in the lower inner right breast.

Final Assessment: Post Procedure Mammograms for Marker Placement

## 2023-06-14 DIAGNOSIS — S6990XA Unspecified injury of unspecified wrist, hand and finger(s), initial encounter: Secondary | ICD-10-CM | POA: Diagnosis not present

## 2023-06-14 DIAGNOSIS — Z6832 Body mass index (BMI) 32.0-32.9, adult: Secondary | ICD-10-CM | POA: Diagnosis not present

## 2023-07-26 DIAGNOSIS — E7800 Pure hypercholesterolemia, unspecified: Secondary | ICD-10-CM | POA: Diagnosis not present

## 2023-07-26 DIAGNOSIS — R7301 Impaired fasting glucose: Secondary | ICD-10-CM | POA: Diagnosis not present

## 2023-07-26 DIAGNOSIS — E559 Vitamin D deficiency, unspecified: Secondary | ICD-10-CM | POA: Diagnosis not present

## 2023-08-01 DIAGNOSIS — E890 Postprocedural hypothyroidism: Secondary | ICD-10-CM | POA: Diagnosis not present

## 2023-08-01 DIAGNOSIS — M810 Age-related osteoporosis without current pathological fracture: Secondary | ICD-10-CM | POA: Diagnosis not present

## 2023-08-01 DIAGNOSIS — E559 Vitamin D deficiency, unspecified: Secondary | ICD-10-CM | POA: Diagnosis not present

## 2023-08-01 DIAGNOSIS — R7301 Impaired fasting glucose: Secondary | ICD-10-CM | POA: Diagnosis not present

## 2023-08-07 DIAGNOSIS — K080 Exfoliation of teeth due to systemic causes: Secondary | ICD-10-CM | POA: Diagnosis not present

## 2023-08-07 DIAGNOSIS — S63639A Sprain of interphalangeal joint of unspecified finger, initial encounter: Secondary | ICD-10-CM | POA: Diagnosis not present

## 2023-09-30 DIAGNOSIS — E538 Deficiency of other specified B group vitamins: Secondary | ICD-10-CM | POA: Diagnosis not present

## 2023-09-30 DIAGNOSIS — E890 Postprocedural hypothyroidism: Secondary | ICD-10-CM | POA: Diagnosis not present

## 2023-10-24 DIAGNOSIS — E039 Hypothyroidism, unspecified: Secondary | ICD-10-CM | POA: Diagnosis not present

## 2023-10-24 DIAGNOSIS — M810 Age-related osteoporosis without current pathological fracture: Secondary | ICD-10-CM | POA: Diagnosis not present

## 2023-10-24 DIAGNOSIS — Z1331 Encounter for screening for depression: Secondary | ICD-10-CM | POA: Diagnosis not present

## 2023-10-24 DIAGNOSIS — Z0000 Encounter for general adult medical examination without abnormal findings: Secondary | ICD-10-CM | POA: Diagnosis not present

## 2023-10-24 DIAGNOSIS — Z1339 Encounter for screening examination for other mental health and behavioral disorders: Secondary | ICD-10-CM | POA: Diagnosis not present

## 2023-10-24 DIAGNOSIS — E559 Vitamin D deficiency, unspecified: Secondary | ICD-10-CM | POA: Diagnosis not present

## 2023-10-24 DIAGNOSIS — Z13220 Encounter for screening for lipoid disorders: Secondary | ICD-10-CM | POA: Diagnosis not present

## 2023-10-24 DIAGNOSIS — Z6832 Body mass index (BMI) 32.0-32.9, adult: Secondary | ICD-10-CM | POA: Diagnosis not present

## 2023-12-26 DIAGNOSIS — M25571 Pain in right ankle and joints of right foot: Secondary | ICD-10-CM | POA: Diagnosis not present

## 2023-12-26 DIAGNOSIS — Z6832 Body mass index (BMI) 32.0-32.9, adult: Secondary | ICD-10-CM | POA: Diagnosis not present

## 2024-01-01 DIAGNOSIS — Z6830 Body mass index (BMI) 30.0-30.9, adult: Secondary | ICD-10-CM | POA: Diagnosis not present

## 2024-01-01 DIAGNOSIS — K5900 Constipation, unspecified: Secondary | ICD-10-CM | POA: Diagnosis not present

## 2024-01-01 DIAGNOSIS — R2689 Other abnormalities of gait and mobility: Secondary | ICD-10-CM | POA: Diagnosis not present

## 2024-01-01 DIAGNOSIS — B070 Plantar wart: Secondary | ICD-10-CM | POA: Diagnosis not present

## 2024-01-13 DIAGNOSIS — R2689 Other abnormalities of gait and mobility: Secondary | ICD-10-CM | POA: Diagnosis not present

## 2024-01-13 DIAGNOSIS — M6281 Muscle weakness (generalized): Secondary | ICD-10-CM | POA: Diagnosis not present

## 2024-01-22 DIAGNOSIS — R7301 Impaired fasting glucose: Secondary | ICD-10-CM | POA: Diagnosis not present

## 2024-01-22 DIAGNOSIS — E7800 Pure hypercholesterolemia, unspecified: Secondary | ICD-10-CM | POA: Diagnosis not present

## 2024-01-22 DIAGNOSIS — E559 Vitamin D deficiency, unspecified: Secondary | ICD-10-CM | POA: Diagnosis not present

## 2024-01-22 DIAGNOSIS — E890 Postprocedural hypothyroidism: Secondary | ICD-10-CM | POA: Diagnosis not present

## 2024-01-27 DIAGNOSIS — M6281 Muscle weakness (generalized): Secondary | ICD-10-CM | POA: Diagnosis not present

## 2024-01-27 DIAGNOSIS — R2689 Other abnormalities of gait and mobility: Secondary | ICD-10-CM | POA: Diagnosis not present

## 2024-01-28 DIAGNOSIS — R7301 Impaired fasting glucose: Secondary | ICD-10-CM | POA: Diagnosis not present

## 2024-01-28 DIAGNOSIS — Z23 Encounter for immunization: Secondary | ICD-10-CM | POA: Diagnosis not present

## 2024-01-28 DIAGNOSIS — E559 Vitamin D deficiency, unspecified: Secondary | ICD-10-CM | POA: Diagnosis not present

## 2024-01-28 DIAGNOSIS — E890 Postprocedural hypothyroidism: Secondary | ICD-10-CM | POA: Diagnosis not present

## 2024-01-28 DIAGNOSIS — M810 Age-related osteoporosis without current pathological fracture: Secondary | ICD-10-CM | POA: Diagnosis not present

## 2024-01-29 DIAGNOSIS — M6281 Muscle weakness (generalized): Secondary | ICD-10-CM | POA: Diagnosis not present

## 2024-01-29 DIAGNOSIS — R2689 Other abnormalities of gait and mobility: Secondary | ICD-10-CM | POA: Diagnosis not present

## 2024-02-05 DIAGNOSIS — M6281 Muscle weakness (generalized): Secondary | ICD-10-CM | POA: Diagnosis not present

## 2024-02-05 DIAGNOSIS — R2689 Other abnormalities of gait and mobility: Secondary | ICD-10-CM | POA: Diagnosis not present

## 2024-02-13 DIAGNOSIS — H43393 Other vitreous opacities, bilateral: Secondary | ICD-10-CM | POA: Diagnosis not present
